# Patient Record
Sex: Male | Born: 2000 | Race: Black or African American | Hispanic: No | Marital: Single | Smoking: Never smoker
Health system: Southern US, Community
[De-identification: ages and names within clinical notes are randomized; demographics above are authoritative.]

## PROBLEM LIST (undated history)

## (undated) ENCOUNTER — Emergency Department: Admission: EM | Discharge status: Absent without leave | Payer: Medicaid Other

## (undated) DIAGNOSIS — J45909 Unspecified asthma, uncomplicated: Secondary | ICD-10-CM

## (undated) DIAGNOSIS — R0981 Nasal congestion: Secondary | ICD-10-CM

---
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
## 2020-01-16 ENCOUNTER — Ambulatory Visit: Payer: Medicaid Other | Attending: Internal Medicine

## 2020-01-16 DIAGNOSIS — Z23 Encounter for immunization: Secondary | ICD-10-CM

## 2020-01-16 NOTE — Progress Notes (Signed)
   Covid-19 Vaccination Clinic  Name:  Nathan Vaughn    MRN: [id number] DOB: May 26, 2000  01/16/2020  Mr. Nathan Vaughn was observed post Covid-19 immunization for 15 minutes without incident. He was provided with Vaccine Information Sheet and instruction to access the V-Safe system.   Mr. Nathan Vaughn was instructed to call 911 with any severe reactions post vaccine: Marland Kitchen Difficulty breathing  . Swelling of face and throat  . A fast heartbeat  . A bad rash all over body  . Dizziness and weakness   Immunizations Administered    No immunizations on file.

## 2021-02-14 ENCOUNTER — Encounter: Payer: Self-pay | Admitting: Nurse Practitioner

## 2021-02-14 ENCOUNTER — Ambulatory Visit (INDEPENDENT_AMBULATORY_CARE_PROVIDER_SITE_OTHER): Payer: Medicaid Other | Admitting: Nurse Practitioner

## 2021-02-14 ENCOUNTER — Other Ambulatory Visit: Payer: Self-pay

## 2021-02-14 VITALS — BP 116/68 | HR 60 | Temp 98.3°F | Resp 16 | Ht 69.0 in | Wt 308.0 lb

## 2021-02-14 DIAGNOSIS — Z6841 Body Mass Index (BMI) 40.0 and over, adult: Secondary | ICD-10-CM

## 2021-02-14 DIAGNOSIS — J4520 Mild intermittent asthma, uncomplicated: Secondary | ICD-10-CM | POA: Diagnosis not present

## 2021-02-14 DIAGNOSIS — M778 Other enthesopathies, not elsewhere classified: Secondary | ICD-10-CM | POA: Diagnosis not present

## 2021-02-14 DIAGNOSIS — Z7689 Persons encountering health services in other specified circumstances: Secondary | ICD-10-CM | POA: Diagnosis not present

## 2021-02-14 MED ORDER — IBUPROFEN 100 MG/5ML PO SUSP: 800.0000 mg | Freq: Three times a day (TID) | ORAL | Status: DC

## 2021-02-14 MED ORDER — MONTELUKAST SODIUM 5 MG PO CHEW: 10.0000 mg | CHEWABLE_TABLET | Freq: Every day | ORAL | 5 refills | Status: DC

## 2021-02-14 MED ORDER — ALBUTEROL SULFATE (2.5 MG/3ML) 0.083% IN NEBU: 2.5000 mg | INHALATION_SOLUTION | Freq: Four times a day (QID) | RESPIRATORY_TRACT | 0 refills | Status: AC | PRN

## 2021-02-14 NOTE — Progress Notes (Signed)
[facility] [place], [place] [address]  Internal MEDICINE  Office Visit Note  Patient Name: Nathan Vaughn  [id number]  [id number]  Date of Service: 02/14/2021   Complaints/HPI Pt is here for establishment of PCP. Chief Complaint  Patient presents with   New Patient (Initial Visit)    Right wrist pain, noticed it when using the mouse, started 2 weeks ago   HPI Nathan Vaughn presents for a new patient visit to establish care. He is a well appearing 21 yo male patient with a history of asthma, accompanied by his mother to his office visit today. His medical history is significant for asthma. His only surgery was a tonsillectomy and he has a family history of asthma, COPD and arthritis. He lives at home with family and works full time at an [employer] in the area. He has a poor diet, eats a lot of fast food and rarely engages in physical activity. He is a nonsmoker and denies alcohol or recreational drug use. When taking medications, he cannot take pill form unless it is chewable or liquid. His last annual physical exam was in February or march last year.  His only pain is his right wrist. He has a wrist brace on today. He was previously seen at [facility] for wrist pain on 02/11/21 (3 days ago). The pain started a couple of weeks ago and he initially noticed it when he was using a computer mouse. An xray of the wrist was done with no acute abnormalities noted and he was given a prescription for ibuprofen liquid and instructed to take 600 mg every 8 hours as needed for pain.  Patient does not consider himself overweight.   Current Medication: Outpatient Encounter Medications as of 02/14/2021  Medication Sig   fluticasone (FLONASE) 50 MCG/ACT nasal spray PLACE 1 SPRAY IN EACH NOSTRIL ONCE A DAY FOR 30 DAYS AS NEEDED   lansoprazole (PREVACID SOLUTAB) 30 MG disintegrating tablet Take by mouth.   montelukast (SINGULAIR) 5 MG chewable tablet Chew 2 tablets (10 mg total) by  mouth at bedtime.   ondansetron (ZOFRAN-ODT) 4 MG disintegrating tablet Take by mouth.   [DISCONTINUED] albuterol (PROVENTIL) (2.5 MG/3ML) 0.083% nebulizer solution    [DISCONTINUED] ibuprofen (ADVIL) 100 MG/5ML suspension ibuprofen 100 mg/5 mL oral suspension  Take 600 mg every 8 hours by oral route for 10 days.   [DISCONTINUED] montelukast (SINGULAIR) 10 MG tablet Take 10 mg by mouth at bedtime.   albuterol (PROVENTIL) (2.5 MG/3ML) 0.083% nebulizer solution Take 3 mLs (2.5 mg total) by nebulization every 6 (six) hours as needed for wheezing or shortness of breath.   ibuprofen (ADVIL) 100 MG/5ML suspension Take 40 mLs (800 mg total) by mouth every 8 (eight) hours.   No facility-administered encounter medications on file as of 02/14/2021.    Surgical History: History reviewed. No pertinent surgical history.  Medical History: Past Medical History:  Diagnosis Date   Asthma    Sinus congestion     Family History: Family History  Problem Relation Age of Onset   COPD Mother    Asthma Mother    Arthritis Mother    COPD Father    Asthma Father    Arthritis Father    Arthritis Maternal Grandmother    Congestive Heart Failure Maternal Grandmother    Arthritis Paternal Grandmother     Social History   Socioeconomic History   Marital status: Single    Spouse name: Not on file   Number of children: Not  on file   Years of education: Not on file   Highest education level: Not on file  Occupational History   Not on file  Tobacco Use   Smoking status: Never   Smokeless tobacco: Never  Substance and Sexual Activity   Alcohol use: No   Drug use: No   Sexual activity: Not on file  Other Topics Concern   Not on file  Social History Narrative   Not on file   Social Determinants of Health   Financial Resource Strain: Not on file  Food Insecurity: Not on file  Transportation Needs: Not on file  Physical Activity: Not on file  Stress: Not on file  Social Connections: Not on  file  Intimate Partner Violence: Not on file     Review of Systems  Constitutional:  Negative for chills, fatigue and unexpected weight change.  HENT:  Negative for congestion, rhinorrhea, sneezing and sore throat.   Eyes:  Negative for redness.  Respiratory:  Negative for cough, chest tightness and shortness of breath.   Cardiovascular:  Negative for chest pain and palpitations.  Gastrointestinal:  Negative for abdominal pain, constipation, diarrhea, nausea and vomiting.  Genitourinary:  Negative for dysuria and frequency.  Musculoskeletal:  Positive for arthralgias and joint swelling. Negative for back pain and neck pain.       Right wrist  Skin:  Negative for rash.  Neurological: Negative.  Negative for tremors and numbness.  Hematological:  Negative for adenopathy. Does not bruise/bleed easily.  Psychiatric/Behavioral:  Negative for behavioral problems (Depression), sleep disturbance and suicidal ideas. The patient is not nervous/anxious.    Vital Signs: BP 116/68    Pulse 60    Temp 98.3 F (36.8 C)    Resp 16    Ht 5\' 9"  (1.753 m)    Wt (!) 308 lb (139.7 kg)    SpO2 99%    BMI 45.48 kg/m    Physical Exam Vitals reviewed.  Constitutional:      General: He is not in acute distress.    Appearance: Normal appearance. He is obese. He is not ill-appearing.  HENT:     Head: Normocephalic and atraumatic.  Eyes:     Pupils: Pupils are equal, round, and reactive to light.  Cardiovascular:     Rate and Rhythm: Normal rate and regular rhythm.  Pulmonary:     Effort: Pulmonary effort is normal. No respiratory distress.  Musculoskeletal:     Right wrist: Decreased range of motion.     Comments: Pain with flexion of the right wrist, pain with ulnar and radial deviation of the right wrist. Tenderness at the base of the wrist on the palmar side.   Neurological:     Mental Status: He is alert and oriented to person, place, and time.     Cranial Nerves: No cranial nerve deficit.      Coordination: Coordination normal.     Gait: Gait normal.  Psychiatric:        Mood and Affect: Mood normal.        Behavior: Behavior normal.      Assessment/Plan: 1. Tendinitis of flexor tendon of right hand Patient instructed to rest and ice his wrist as need. Encouraged patient to continue to wear the wrist brace to keep his wrist immobilized in neutral position. He can take 800 mg of ibuprofen liquid every 8 hours for inflammation and pain. Patient provided with information about tendinitis and stretches and exercises to do to help stretch and  strengthen his wrist.  - ibuprofen (ADVIL) 100 MG/5ML suspension; Take 40 mLs (800 mg total) by mouth every 8 (eight) hours.  2. Mild intermittent asthma without complication Patient has asthma and has albuterol nebulizer treatments as needed and takes montelukast as well. Has not had an attack in at least 5 years. - albuterol (PROVENTIL) (2.5 MG/3ML) 0.083% nebulizer solution; Take 3 mLs (2.5 mg total) by nebulization every 6 (six) hours as needed for wheezing or shortness of breath.  Dispense: 75 mL; Refill: 0 - montelukast (SINGULAIR) 5 MG chewable tablet; Chew 2 tablets (10 mg total) by mouth at bedtime.  Dispense: 60 tablet; Refill: 5  3. Class 3 severe obesity due to excess calories without serious comorbidity with body mass index (BMI) of 45.0 to 49.9 in adult [facility]) Patient does not see any issue with his current weight. Will discuss obesity at next office visit including screening for symptoms of obstructive sleep apnea and whether a sleep study should be done. Need to discuss diet and exercise further, will also consider testing for diabetes.   4. Encounter to establish care with new doctor Establishing with new PCP, is due for CPE in march.      General Counseling: Nirvan verbalizes understanding of the findings of todays visit and agrees with plan of treatment. I have discussed any further diagnostic evaluation that may be needed or  ordered today. We also reviewed his medications today. he has been encouraged to call the office with any questions or concerns that should arise related to todays visit.    Counseling:  [place] Controlled Substance Database was reviewed by me.  No orders of the defined types were placed in this encounter.   Meds ordered this encounter  Medications   ibuprofen (ADVIL) 100 MG/5ML suspension    Sig: Take 40 mLs (800 mg total) by mouth every 8 (eight) hours.   albuterol (PROVENTIL) (2.5 MG/3ML) 0.083% nebulizer solution    Sig: Take 3 mLs (2.5 mg total) by nebulization every 6 (six) hours as needed for wheezing or shortness of breath.    Dispense:  75 mL    Refill:  0   montelukast (SINGULAIR) 5 MG chewable tablet    Sig: Chew 2 tablets (10 mg total) by mouth at bedtime.    Dispense:  60 tablet    Refill:  5    Return for CPE, Coral Soler PCP at earliest available appt after february. .  Time spent:30 Minutes  This patient was seen by Jonetta Osgood, FNP-C in collaboration with Dr. Clayborn Bigness as a part of collaborative care agreement.    Silvanna Ohmer R. Valetta Fuller, MSN, FNP-C Internal Medicine

## 2021-04-15 ENCOUNTER — Encounter: Payer: Medicaid Other | Admitting: Nurse Practitioner

## 2021-04-15 DIAGNOSIS — Z0289 Encounter for other administrative examinations: Secondary | ICD-10-CM

## 2021-05-01 ENCOUNTER — Ambulatory Visit (INDEPENDENT_AMBULATORY_CARE_PROVIDER_SITE_OTHER): Payer: Medicaid Other | Admitting: Internal Medicine

## 2021-05-01 ENCOUNTER — Encounter: Payer: Self-pay | Admitting: Internal Medicine

## 2021-05-01 ENCOUNTER — Other Ambulatory Visit: Payer: Self-pay

## 2021-05-01 VITALS — BP 132/72 | HR 94 | Temp 97.9°F | Resp 16 | Ht 69.0 in | Wt 308.0 lb

## 2021-05-01 DIAGNOSIS — Z0001 Encounter for general adult medical examination with abnormal findings: Secondary | ICD-10-CM | POA: Diagnosis not present

## 2021-05-01 DIAGNOSIS — K219 Gastro-esophageal reflux disease without esophagitis: Secondary | ICD-10-CM | POA: Diagnosis not present

## 2021-05-01 DIAGNOSIS — J4520 Mild intermittent asthma, uncomplicated: Secondary | ICD-10-CM

## 2021-05-01 DIAGNOSIS — R300 Dysuria: Secondary | ICD-10-CM

## 2021-05-01 DIAGNOSIS — N62 Hypertrophy of breast: Secondary | ICD-10-CM | POA: Diagnosis not present

## 2021-05-01 DIAGNOSIS — Z6841 Body Mass Index (BMI) 40.0 and over, adult: Secondary | ICD-10-CM

## 2021-05-01 NOTE — Progress Notes (Signed)
?[facility] ?[address] ?[place], [place] [address] ? ?Internal MEDICINE  ?Office Visit Note ? ?Patient Name: Nathan Vaughn ? [id number]  ?[id number] ? ?Date of Service: 05/07/2021 ? ?Chief Complaint  ?Patient presents with  ? Annual Exam  ? Asthma  ? ? ? ?HPI ?Pt is here for routine health maintenance examination ?-Patient has past medical history of allergies and asthma which are controlled with medications. ?-He also takes Prevacid for reflux which is controlled with medication as well. ?-He had a sleep study last year and was advised to that does not have sleep apnea however he has to lose weight since then patient has lost over 20 pounds. ?-He still struggling with his weight, patient is not sexually ?-He is very soft-spoken works for [employer] finished high school, sleeps well at night slight anxiety but no depression ?Current Medication: ?Outpatient Encounter Medications as of 05/01/2021  ?Medication Sig  ? albuterol (PROVENTIL) (2.5 MG/3ML) 0.083% nebulizer solution Take 3 mLs (2.5 mg total) by nebulization every 6 (six) hours as needed for wheezing or shortness of breath.  ? fluticasone (FLONASE) 50 MCG/ACT nasal spray PLACE 1 SPRAY IN EACH NOSTRIL ONCE A DAY FOR 30 DAYS AS NEEDED  ? ibuprofen (ADVIL) 100 MG/5ML suspension Take 40 mLs (800 mg total) by mouth every 8 (eight) hours.  ? lansoprazole (PREVACID SOLUTAB) 30 MG disintegrating tablet Take by mouth.  ? montelukast (SINGULAIR) 5 MG chewable tablet Chew 2 tablets (10 mg total) by mouth at bedtime.  ? ondansetron (ZOFRAN-ODT) 4 MG disintegrating tablet Take by mouth.  ? ?No facility-administered encounter medications on file as of 05/01/2021.  ? ? ?Surgical History: ?History reviewed. No pertinent surgical history. ? ?Medical History: ?Past Medical History:  ?Diagnosis Date  ? Asthma   ? Sinus congestion   ? ? ?Family History: ?Family History  ?Problem Relation Age of Onset  ? COPD Mother   ? Asthma Mother   ? Arthritis Mother   ? COPD Father    ? Asthma Father   ? Arthritis Father   ? Arthritis Maternal Grandmother   ? Congestive Heart Failure Maternal Grandmother   ? Arthritis Paternal Grandmother   ? ? ?Social History: ?Social History  ? ?Socioeconomic History  ? Marital status: Single  ?  Spouse name: Not on file  ? Number of children: Not on file  ? Years of education: Not on file  ? Highest education level: Not on file  ?Occupational History  ? Not on file  ?Tobacco Use  ? Smoking status: Never  ? Smokeless tobacco: Never  ?Substance and Sexual Activity  ? Alcohol use: No  ? Drug use: No  ? Sexual activity: Not on file  ?Other Topics Concern  ? Not on file  ?Social History Narrative  ? Not on file  ? ?Social Determinants of Health  ? ?Financial Resource Strain: Not on file  ?Food Insecurity: Not on file  ?Transportation Needs: Not on file  ?Physical Activity: Not on file  ?Stress: Not on file  ?Social Connections: Not on file  ?  ? ? ?Review of Systems  ?Constitutional:  Negative for chills, fatigue and unexpected weight change.  ?HENT:  Positive for postnasal drip. Negative for congestion, rhinorrhea, sneezing and sore throat.   ?Eyes:  Negative for redness.  ?Respiratory:  Negative for cough, chest tightness and shortness of breath.   ?Cardiovascular:  Negative for chest pain and palpitations.  ?Gastrointestinal:  Negative for abdominal pain, constipation, diarrhea, nausea and vomiting.  ?  Genitourinary:  Negative for dysuria and frequency.  ?Musculoskeletal:  Negative for arthralgias, back pain, joint swelling and neck pain.  ?Skin:  Negative for rash.  ?Neurological: Negative.  Negative for tremors and numbness.  ?Hematological:  Negative for adenopathy. Does not bruise/bleed easily.  ?Psychiatric/Behavioral:  Negative for behavioral problems (Depression), sleep disturbance and suicidal ideas. The patient is not nervous/anxious.   ? ? ?Vital Signs: ?BP 132/72   Pulse 94   Temp 97.9 ?F (36.6 ?C)   Resp 16   Ht 5\' 9"  (1.753 m)   Wt (!) 308 lb  (139.7 kg)   SpO2 98%   BMI 45.48 kg/m?  ? ? ?Physical Exam ?Constitutional:   ?   Appearance: Normal appearance.  ?HENT:  ?   Head: Normocephalic and atraumatic.  ?   Nose: Nose normal.  ?   Mouth/Throat:  ?   Mouth: Mucous membranes are moist.  ?   Pharynx: No posterior oropharyngeal erythema.  ?Eyes:  ?   Extraocular Movements: Extraocular movements intact.  ?   Pupils: Pupils are equal, round, and reactive to light.  ?Cardiovascular:  ?   Pulses: Normal pulses.  ?   Heart sounds: Normal heart sounds.  ?Pulmonary:  ?   Effort: Pulmonary effort is normal.  ?   Breath sounds: Normal breath sounds.  ?Chest:  ?   Comments: Enlarged breast  ?Neurological:  ?   General: No focal deficit present.  ?   Mental Status: He is alert.  ?Psychiatric:     ?   Mood and Affect: Mood normal.     ?   Behavior: Behavior normal.  ? ? ? ?LABS: ?Recent Results (from the past 2160 hour(s))  ?UA/M w/rflx Culture, Routine     Status: Abnormal  ? Collection Time: 05/01/21 10:01 AM  ? Specimen: Urine  ? Urine  ?Result Value Ref Range  ? Specific Gravity, UA      >=1.030 (A) 1.005 - 1.030  ? pH, UA 6.5 5.0 - 7.5  ? Color, UA Yellow Yellow  ? Appearance Ur Turbid (A) Clear  ? Leukocytes,UA Negative Negative  ? Protein,UA Trace Negative/Trace  ? Glucose, UA Negative Negative  ? Ketones, UA Trace (A) Negative  ? RBC, UA Negative Negative  ? Bilirubin, UA Negative Negative  ? Urobilinogen, Ur 1.0 0.2 - 1.0 mg/dL  ? Nitrite, UA Negative Negative  ? Microscopic Examination Comment   ?  Comment: Microscopic follows if indicated.  ? Microscopic Examination See below:   ?  Comment: Microscopic was indicated and was performed.  ? Urinalysis Reflex Comment   ?  Comment: This specimen will not reflex to a Urine Culture.  ?Microscopic Examination     Status: None  ? Collection Time: 05/01/21 10:01 AM  ? Urine  ?Result Value Ref Range  ? WBC, UA None seen 0 - 5 /hpf  ? RBC 0-2 0 - 2 /hpf  ? Epithelial Cells (non renal) None seen 0 - 10 /hpf  ? Casts  None seen None seen /lpf  ? Bacteria, UA None seen None seen/Few  ? ? ? ? ? ?Assessment/Plan: ?1. Encounter for general adult medical examination with abnormal findings ?-All preventive health maintenance is updated, will add fasting lipid profile ? ?2. Mild intermittent asthma without complication ?-Continue all medications as before no acute flareups ? ?3. Class 3 severe obesity due to excess calories without serious comorbidity with body mass index (BMI) of 45.0 to 49.9 in adult [facility]) ?-Patient is already  trying to lose weight and is doing well with weight loss  ?Obesity Counseling: ?Risk Assessment: An assessment of behavioral risk factors was made today and includes lack of exercise sedentary lifestyle, lack of portion control and poor dietary habits. ? ?Risk Modification Advice: She was counseled on portion control guidelines. Restricting daily caloric intake to 1500. The detrimental Ambrosio term effects of obesity on her health and ongoing poor compliance was also discussed with the patient. ?  ?4. GERD without esophagitis ?Continue Prevacid as before ? ?5. Idiopathic gynecomastia ?On examination patient has gynecomastia we will check prolactin level and estrogen and testosterone level as well treat as indicated ? ? ?6. Dysuria ?- UA/M w/rflx Culture, Routine ? ?General Counseling: Candido verbalizes understanding of the findings of todays visit and agrees with plan of treatment. I have discussed any further diagnostic evaluation that may be needed or ordered today. We also reviewed his medications today. he has been encouraged to call the office with any questions or concerns that should arise related to todays visit. ? ? ? ?Counseling: ? ?[place] Controlled Substance Database was reviewed by me. ? ?Orders Placed This Encounter  ?Procedures  ? Microscopic Examination  ? UA/M w/rflx Culture, Routine  ? Prolactin  ? TSH + free T4  ? Lipid Profile  ? Testosterone,Free and Total  ? Estrogens, total  ? ? ?No orders of  the defined types were placed in this encounter. ? ? ?Total time spent:35 Minutes ? ?Time spent includes review of chart, medications, test results, and follow up plan with the patient.  ? ? ? ?Lyndon CodeFozia M Karim Aiello, MD ?

## 2021-05-02 LAB — MICROSCOPIC EXAMINATION
Bacteria, UA: NONE SEEN
Casts: NONE SEEN /lpf
Epithelial Cells (non renal): NONE SEEN /hpf (ref 0–10)
WBC, UA: NONE SEEN /hpf (ref 0–5)

## 2021-05-02 LAB — UA/M W/RFLX CULTURE, ROUTINE
Bilirubin, UA: NEGATIVE
Glucose, UA: NEGATIVE
Leukocytes,UA: NEGATIVE
Nitrite, UA: NEGATIVE
RBC, UA: NEGATIVE
Specific Gravity, UA: >=1.03 — AB (ref 1.005–1.030)
Urobilinogen, Ur: 1 mg/dL (ref 0.2–1.0)
pH, UA: 6.5 (ref 5.0–7.5)

## 2021-09-18 ENCOUNTER — Encounter: Payer: Self-pay | Admitting: Nurse Practitioner

## 2021-09-18 ENCOUNTER — Telehealth: Payer: Medicaid Other | Admitting: Nurse Practitioner

## 2021-09-18 ENCOUNTER — Telehealth: Payer: Self-pay

## 2021-09-18 VITALS — Resp 16

## 2021-09-18 DIAGNOSIS — U071 COVID-19: Secondary | ICD-10-CM | POA: Diagnosis not present

## 2021-09-18 DIAGNOSIS — J069 Acute upper respiratory infection, unspecified: Secondary | ICD-10-CM | POA: Diagnosis not present

## 2021-09-18 NOTE — Telephone Encounter (Signed)
Emailed work note to patient-Toni 

## 2021-09-18 NOTE — Progress Notes (Signed)
[facility] [address] [place], [place] [address]  Internal MEDICINE  Telephone Visit  Patient Name: Nathan Vaughn  [id number]  [id number]  Date of Service: 09/18/2021  I connected with the patient at 1245 by telephone and verified the patients identity using two identifiers.   I discussed the limitations, risks, security and privacy concerns of performing an evaluation and management service by telephone and the availability of in person appointments. I also discussed with the patient that there may be a patient responsible charge related to the service.  The patient expressed understanding and agrees to proceed.    Chief Complaint  Patient presents with   Telephone Assessment    [phone] - prefers video call   Telephone Screen   Sore Throat   Sinusitis   Covid Positive    HPI Nathan Vaughn presents for a telehealth virtual visit for COVID positive URI and sinusitis. His main symptoms are sore throat, nasal congestion, slight sinus pressure, runny nose and mild fatigue.  Uses Nasal spray fluticasone,    Current Medication: Outpatient Encounter Medications as of 09/18/2021  Medication Sig   albuterol (PROVENTIL) (2.5 MG/3ML) 0.083% nebulizer solution Take 3 mLs (2.5 mg total) by nebulization every 6 (six) hours as needed for wheezing or shortness of breath.   fluticasone (FLONASE) 50 MCG/ACT nasal spray PLACE 1 SPRAY IN EACH NOSTRIL ONCE A DAY FOR 30 DAYS AS NEEDED   ibuprofen (ADVIL) 100 MG/5ML suspension Take 40 mLs (800 mg total) by mouth every 8 (eight) hours.   lansoprazole (PREVACID SOLUTAB) 30 MG disintegrating tablet Take by mouth.   ondansetron (ZOFRAN-ODT) 4 MG disintegrating tablet Take by mouth.   [DISCONTINUED] montelukast (SINGULAIR) 5 MG chewable tablet Chew 2 tablets (10 mg total) by mouth at bedtime.   No facility-administered encounter medications on file as of 09/18/2021.    Surgical History: History reviewed. No pertinent surgical  history.  Medical History: Past Medical History:  Diagnosis Date   Asthma    Sinus congestion     Family History: Family History  Problem Relation Age of Onset   COPD Mother    Asthma Mother    Arthritis Mother    COPD Father    Asthma Father    Arthritis Father    Arthritis Maternal Grandmother    Congestive Heart Failure Maternal Grandmother    Arthritis Paternal Grandmother     Social History   Socioeconomic History   Marital status: Single    Spouse name: Not on file   Number of children: Not on file   Years of education: Not on file   Highest education level: Not on file  Occupational History   Not on file  Tobacco Use   Smoking status: Never   Smokeless tobacco: Never  Substance and Sexual Activity   Alcohol use: No   Drug use: No   Sexual activity: Not on file  Other Topics Concern   Not on file  Social History Narrative   Not on file   Social Determinants of Health   Financial Resource Strain: Not on file  Food Insecurity: Not on file  Transportation Needs: Not on file  Physical Activity: Not on file  Stress: Not on file  Social Connections: Not on file  Intimate Partner Violence: Not on file      Review of Systems  Constitutional:  Positive for fatigue.  HENT:  Positive for congestion, postnasal drip, rhinorrhea and sinus pressure.   Respiratory:  Negative for cough, chest tightness, shortness  of breath and wheezing.   Cardiovascular: Negative.  Negative for chest pain and palpitations.  Neurological:  Negative for headaches.    Vital Signs: Resp 16    Observation/Objective: He is alert and oriented and engages in conversation appropriately. He does not appear to be in any acute distress over video call.     Assessment/Plan: 1. Upper respiratory tract infection due to COVID-19 virus Mild symptoms, OTC symptomatic relief. No antiviral medication at this time   General Counseling: Nathan Vaughn verbalizes understanding of the findings  of today's phone visit and agrees with plan of treatment. I have discussed any further diagnostic evaluation that may be needed or ordered today. We also reviewed his medications today. he has been encouraged to call the office with any questions or concerns that should arise related to todays visit.  Return if symptoms worsen or fail to improve.   No orders of the defined types were placed in this encounter.   No orders of the defined types were placed in this encounter.   Time spent:30 Minutes Time spent with patient included reviewing progress notes, labs, imaging studies, and discussing plan for follow up.  [place] Controlled Substance Database was reviewed by me for overdose risk score (ORS) if appropriate.  This patient was seen by Sallyanne Kuster, FNP-C in collaboration with Dr. Beverely Risen as a part of collaborative care agreement.  Ardie Dragoo R. Tedd Sias, MSN, FNP-C Internal medicine

## 2021-09-28 ENCOUNTER — Other Ambulatory Visit: Payer: Self-pay | Admitting: Nurse Practitioner

## 2021-09-28 DIAGNOSIS — J4520 Mild intermittent asthma, uncomplicated: Secondary | ICD-10-CM

## 2022-03-25 ENCOUNTER — Telehealth (INDEPENDENT_AMBULATORY_CARE_PROVIDER_SITE_OTHER): Payer: 59 | Admitting: Internal Medicine

## 2022-03-25 ENCOUNTER — Telehealth: Payer: Self-pay | Admitting: Nurse Practitioner

## 2022-03-25 VITALS — Ht 69.0 in | Wt 312.0 lb

## 2022-03-25 DIAGNOSIS — J45909 Unspecified asthma, uncomplicated: Secondary | ICD-10-CM | POA: Diagnosis not present

## 2022-03-25 MED ORDER — AZITHROMYCIN 200 MG/5ML PO SUSR: ORAL | 0 refills | Status: DC

## 2022-03-25 NOTE — Progress Notes (Signed)
[facility] [place], [place] [address]  Internal MEDICINE  Telephone Visit  Patient Name: Nathan Vaughn  [id number]  [id number]  Date of Service: 04/01/2022  I connected with the patient at 905 by telephone and verified the patients identity using two identifiers.   I discussed the limitations, risks, security and privacy concerns of performing an evaluation and management service by telephone and the availability of in person appointments. I also discussed with the patient that there may be a patient responsible charge related to the service.  The patient expressed understanding and agrees to proceed.    Chief Complaint  Patient presents with   Telephone Assessment    [id number]   Telephone Screen   Sinusitis    Symptoms last  Friday and covid test negative    Sore Throat   Cough   Headache    HPI  Pt has been sick since Friday Has sore throat, runny nose and cough Mild chills as well along with headaches  Pt has h/o asthma as well    Current Medication: Outpatient Encounter Medications as of 03/25/2022  Medication Sig   albuterol (PROVENTIL) (2.5 MG/3ML) 0.083% nebulizer solution Take 3 mLs (2.5 mg total) by nebulization every 6 (six) hours as needed for wheezing or shortness of breath.   azithromycin (ZITHROMAX) 200 MG/5ML suspension Take 10 ml every day for 5 days   fluticasone (FLONASE) 50 MCG/ACT nasal spray PLACE 1 SPRAY IN EACH NOSTRIL ONCE A DAY FOR 30 DAYS AS NEEDED   ibuprofen (ADVIL) 100 MG/5ML suspension Take 40 mLs (800 mg total) by mouth every 8 (eight) hours.   lansoprazole (PREVACID SOLUTAB) 30 MG disintegrating tablet Take by mouth.   ondansetron (ZOFRAN-ODT) 4 MG disintegrating tablet Take by mouth.   [DISCONTINUED] montelukast (SINGULAIR) 5 MG chewable tablet CHEW 2 TABLETS (10 MG TOTAL) BY MOUTH AT BEDTIME.   No facility-administered encounter medications on file as of 03/25/2022.    Surgical History: No past surgical  history on file.  Medical History: Past Medical History:  Diagnosis Date   Asthma    Sinus congestion     Family History: Family History  Problem Relation Age of Onset   COPD Mother    Asthma Mother    Arthritis Mother    COPD Father    Asthma Father    Arthritis Father    Arthritis Maternal Grandmother    Congestive Heart Failure Maternal Grandmother    Arthritis Paternal Grandmother     Social History   Socioeconomic History   Marital status: Single    Spouse name: Not on file   Number of children: Not on file   Years of education: Not on file   Highest education level: Not on file  Occupational History   Not on file  Tobacco Use   Smoking status: Never   Smokeless tobacco: Never  Substance and Sexual Activity   Alcohol use: No   Drug use: No   Sexual activity: Not on file  Other Topics Concern   Not on file  Social History Narrative   Not on file   Social Determinants of Health   Financial Resource Strain: Not on file  Food Insecurity: Not on file  Transportation Needs: Not on file  Physical Activity: Not on file  Stress: Not on file  Social Connections: Not on file  Intimate Partner Violence: Not on file      Review of Systems  Constitutional:  Negative for fatigue and fever.  HENT:  Negative for congestion, mouth sores and postnasal drip.   Respiratory:  Positive for cough.   Cardiovascular:  Negative for chest pain.  Genitourinary:  Negative for flank pain.  Psychiatric/Behavioral: Negative.      Vital Signs: Ht '5\' 9"'$  (1.753 m)   Wt (!) 312 lb (141.5 kg)   BMI 46.07 kg/m    Observation/Objective:  Coughing    Assessment/Plan: 1. Acute asthmatic bronchitis Pt will get work note  - azithromycin (ZITHROMAX) 200 MG/5ML suspension; Take 10 ml every day for 5 days  Dispense: 50 mL; Refill: 0   General Counseling: Nathan Vaughn verbalizes understanding of the findings of today's phone visit and agrees with plan of treatment. I have  discussed any further diagnostic evaluation that may be needed or ordered today. We also reviewed his medications today. he has been encouraged to call the office with any questions or concerns that should arise related to todays visit.    No orders of the defined types were placed in this encounter.   Meds ordered this encounter  Medications   azithromycin (ZITHROMAX) 200 MG/5ML suspension    Sig: Take 10 ml every day for 5 days    Dispense:  50 mL    Refill:  0    Time spent:10 Minutes    Dr Lavera Guise Internal medicine

## 2022-03-25 NOTE — Telephone Encounter (Signed)
Work note emailed to [employer]

## 2022-03-26 ENCOUNTER — Telehealth: Payer: 59 | Admitting: Internal Medicine

## 2022-03-28 ENCOUNTER — Other Ambulatory Visit: Payer: Self-pay | Admitting: Internal Medicine

## 2022-03-28 DIAGNOSIS — J4520 Mild intermittent asthma, uncomplicated: Secondary | ICD-10-CM

## 2022-05-06 ENCOUNTER — Ambulatory Visit (INDEPENDENT_AMBULATORY_CARE_PROVIDER_SITE_OTHER): Payer: Self-pay | Admitting: Nurse Practitioner

## 2022-05-06 ENCOUNTER — Encounter: Payer: Self-pay | Admitting: Nurse Practitioner

## 2022-05-06 VITALS — BP 129/80 | HR 74 | Temp 98.2°F | Resp 16 | Ht 69.0 in | Wt 324.4 lb

## 2022-05-06 DIAGNOSIS — Z0001 Encounter for general adult medical examination with abnormal findings: Secondary | ICD-10-CM | POA: Diagnosis not present

## 2022-05-06 DIAGNOSIS — N62 Hypertrophy of breast: Secondary | ICD-10-CM

## 2022-05-06 DIAGNOSIS — Z6841 Body Mass Index (BMI) 40.0 and over, adult: Secondary | ICD-10-CM

## 2022-05-06 DIAGNOSIS — M92529 Juvenile osteochondrosis of tibia tubercle, unspecified leg: Secondary | ICD-10-CM | POA: Insufficient documentation

## 2022-05-06 DIAGNOSIS — K219 Gastro-esophageal reflux disease without esophagitis: Secondary | ICD-10-CM | POA: Diagnosis not present

## 2022-05-06 MED ORDER — LANSOPRAZOLE 30 MG PO TBDD: 30.0000 mg | DELAYED_RELEASE_TABLET | Freq: Every day | ORAL | 11 refills | Status: AC | PRN

## 2022-05-06 NOTE — Progress Notes (Signed)
[facility] [place],  [address]  Internal MEDICINE  Office Visit Note  Patient Name: Nathan Vaughn  [id number]  [id number]  Date of Service: 05/06/2022  Chief Complaint  Patient presents with   Annual Exam    HPI [name] presents for an annual well visit and physical exam.  Well-appearing 22 y.o. male with asthma, GERD, and allergic rhinitis Labs:  due for routine labs  New or worsening pain: none Other concerns: none Did not have labs drawn last year      Current Medication: Outpatient Encounter Medications as of 05/06/2022  Medication Sig   albuterol (PROVENTIL) (2.5 MG/3ML) 0.083% nebulizer solution Take 3 mLs (2.5 mg total) by nebulization every 6 (six) hours as needed for wheezing or shortness of breath.   fluticasone (FLONASE) 50 MCG/ACT nasal spray PLACE 1 SPRAY IN EACH NOSTRIL ONCE A DAY FOR 30 DAYS AS NEEDED   montelukast (SINGULAIR) 5 MG chewable tablet CHEW 2 TABS BY MOUTH AT BEDTIME   [DISCONTINUED] azithromycin (ZITHROMAX) 200 MG/5ML suspension Take 10 ml every day for 5 days   [DISCONTINUED] ibuprofen (ADVIL) 100 MG/5ML suspension Take 40 mLs (800 mg total) by mouth every 8 (eight) hours.   [DISCONTINUED] lansoprazole (PREVACID SOLUTAB) 30 MG disintegrating tablet Take by mouth.   [DISCONTINUED] ondansetron (ZOFRAN-ODT) 4 MG disintegrating tablet Take by mouth.   lansoprazole (PREVACID SOLUTAB) 30 MG disintegrating tablet Take 1 tablet (30 mg total) by mouth daily as needed (acid reflux).   No facility-administered encounter medications on file as of 05/06/2022.    Surgical History: History reviewed. No pertinent surgical history.  Medical History: Past Medical History:  Diagnosis Date   Asthma    Sinus congestion     Family History: Family History  Problem Relation Age of Onset   COPD Mother    Asthma Mother    Arthritis Mother    COPD Father    Asthma Father    Arthritis Father    Arthritis Maternal Grandmother     Congestive Heart Failure Maternal Grandmother    Arthritis Paternal Grandmother     Social History   Socioeconomic History   Marital status: Single    Spouse name: Not on file   Number of children: Not on file   Years of education: Not on file   Highest education level: Not on file  Occupational History   Not on file  Tobacco Use   Smoking status: Never   Smokeless tobacco: Never  Substance and Sexual Activity   Alcohol use: No   Drug use: No   Sexual activity: Not on file  Other Topics Concern   Not on file  Social History Narrative   Not on file   Social Determinants of Health   Financial Resource Strain: Not on file  Food Insecurity: Not on file  Transportation Needs: Not on file  Physical Activity: Not on file  Stress: Not on file  Social Connections: Not on file  Intimate Partner Violence: Not on file      Review of Systems  Constitutional:  Negative for chills, fatigue and unexpected weight change.  HENT:  Positive for postnasal drip. Negative for congestion, rhinorrhea, sneezing and sore throat.   Eyes:  Negative for redness.  Respiratory:  Negative for cough, chest tightness and shortness of breath.   Cardiovascular:  Negative for chest pain and palpitations.  Gastrointestinal:  Negative for abdominal pain, constipation, diarrhea, nausea and vomiting.  Genitourinary:  Negative for dysuria and frequency.  Musculoskeletal:  Negative for arthralgias, back pain, joint swelling and neck pain.  Skin:  Negative for rash.  Neurological: Negative.  Negative for tremors and numbness.  Hematological:  Negative for adenopathy. Does not bruise/bleed easily.  Psychiatric/Behavioral:  Negative for behavioral problems (Depression), sleep disturbance and suicidal ideas. The patient is not nervous/anxious.     Vital Signs: BP 129/80   Pulse 74   Temp 98.2 F (36.8 C)   Resp 16   Ht 5\' 9"  (1.753 m)   Wt (!) 324 lb 6.4 oz (147.1 kg)   SpO2 96%   BMI 47.91 kg/m     Physical Exam Vitals reviewed.  Constitutional:      General: He is awake. He is not in acute distress.    Appearance: Normal appearance. He is well-developed and well-groomed. He is obese. He is not ill-appearing.  HENT:     Head: Normocephalic and atraumatic.     Right Ear: Tympanic membrane, ear canal and external ear normal.     Left Ear: Tympanic membrane, ear canal and external ear normal.     Nose: Nose normal.     Mouth/Throat:     Lips: Pink.     Mouth: Mucous membranes are moist.     Pharynx: Oropharynx is clear. Uvula midline. No oropharyngeal exudate or posterior oropharyngeal erythema.  Eyes:     General: Lids are normal. Vision grossly intact. Gaze aligned appropriately.     Extraocular Movements: Extraocular movements intact.     Conjunctiva/sclera: Conjunctivae normal.     Pupils: Pupils are equal, round, and reactive to light.  Neck:     Trachea: Trachea and phonation normal.  Cardiovascular:     Rate and Rhythm: Normal rate and regular rhythm.     Pulses: Normal pulses.     Heart sounds: Normal heart sounds, S1 normal and S2 normal.  Pulmonary:     Effort: Pulmonary effort is normal. No accessory muscle usage or respiratory distress.     Breath sounds: Normal breath sounds and air entry. No decreased breath sounds or wheezing.  Chest:     Chest wall: Swelling and edema present.     Comments: Enlarged breast  Abdominal:     General: Bowel sounds are normal.     Palpations: Abdomen is soft.     Tenderness: There is no abdominal tenderness.  Musculoskeletal:     Cervical back: Normal range of motion and neck supple.     Right lower leg: No edema.     Left lower leg: No edema.  Lymphadenopathy:     Cervical: No cervical adenopathy.  Skin:    General: Skin is warm and dry.     Capillary Refill: Capillary refill takes less than 2 seconds.  Neurological:     General: No focal deficit present.     Mental Status: He is alert and oriented to person, place,  and time.  Psychiatric:        Mood and Affect: Mood and affect normal.        Behavior: Behavior normal. Behavior is cooperative.        Thought Content: Thought content normal.        Judgment: Judgment normal.        Assessment/Plan: 1. Encounter for general adult medical examination with abnormal findings Age-appropriate preventive screenings and vaccinations discussed, annual physical exam completed. Routine labs for health maintenance ordered, see below. PHM updated.  - Prolactin - Testosterone,Free and Total - Estrogens, total - Lipid  Profile - TSH + free T4 - CMP14+EGFR  2. GERD without esophagitis Routine labs ordered. Continue lansoprazole as prescribed.  - Prolactin - Testosterone,Free and Total - Estrogens, total - Lipid Profile - TSH + free T4 - CMP14+EGFR - lansoprazole (PREVACID SOLUTAB) 30 MG disintegrating tablet; Take 1 tablet (30 mg total) by mouth daily as needed (acid reflux).  Dispense: 30 tablet; Refill: 11  3. Idiopathic gynecomastia Routine and additional labs ordered - Prolactin - Testosterone,Free and Total - Estrogens, total - Lipid Profile - TSH + free T4 - CMP14+EGFR  4. Class 3 severe obesity due to excess calories without serious comorbidity with body mass index (BMI) of 45.0 to 49.9 in adult Routine labs ordered - Prolactin - Testosterone,Free and Total - Estrogens, total - Lipid Profile - TSH + free T4 - CMP14+EGFR      General Counseling: [name] verbalizes understanding of the findings of todays visit and agrees with plan of treatment. I have discussed any further diagnostic evaluation that may be needed or ordered today. We also reviewed his medications today. he has been encouraged to call the office with any questions or concerns that should arise related to todays visit.    Orders Placed This Encounter  Procedures   Prolactin   Testosterone,Free and Total   Estrogens, total   Lipid Profile   TSH + free T4    CMP14+EGFR    Meds ordered this encounter  Medications   lansoprazole (PREVACID SOLUTAB) 30 MG disintegrating tablet    Sig: Take 1 tablet (30 mg total) by mouth daily as needed (acid reflux).    Dispense:  30 tablet    Refill:  11    Return in about 1 year (around 05/06/2023) for CPE, [place] PCP and otherwise as needed..   Total time spent:30 Minutes Time spent includes review of chart, medications, test results, and follow up plan with the patient.   [place] Controlled Substance Database was reviewed by me.  This patient was seen by Jonetta Osgood, FNP-C in collaboration with Dr. Clayborn Bigness as a part of collaborative care agreement.  Geron Mulford R. Valetta Fuller, MSN, FNP-C Internal medicine

## 2022-06-03 LAB — CMP14+EGFR: BUN: 14 mg/dL (ref 6–20)

## 2022-06-04 ENCOUNTER — Ambulatory Visit: Payer: Self-pay | Admitting: Nurse Practitioner

## 2022-06-04 LAB — CMP14+EGFR: Bilirubin Total: 0.4 mg/dL (ref 0.0–1.2)

## 2022-06-04 LAB — TESTOSTERONE,FREE AND TOTAL: Testosterone, Free: 14.2 pg/mL (ref 9.3–26.5)

## 2022-06-06 LAB — CMP14+EGFR
ALT: 43 IU/L (ref 0–44)
AST: 33 IU/L (ref 0–40)
Albumin/Globulin Ratio: 1.6 (ref 1.2–2.2)
Albumin: 4.3 g/dL (ref 4.3–5.2)
Alkaline Phosphatase: 74 IU/L (ref 44–121)
BUN/Creatinine Ratio: 15 (ref 9–20)
CO2: 20 mmol/L (ref 20–29)
Calcium: 9.1 mg/dL (ref 8.7–10.2)
Chloride: 100 mmol/L (ref 96–106)
Creatinine, Ser: 0.93 mg/dL (ref 0.76–1.27)
Globulin, Total: 2.7 g/dL (ref 1.5–4.5)
Glucose: 107 mg/dL — ABNORMAL HIGH (ref 70–99)
Potassium: 4.5 mmol/L (ref 3.5–5.2)
Sodium: 137 mmol/L (ref 134–144)
Total Protein: 7 g/dL (ref 6.0–8.5)
eGFR: 119 mL/min/{1.73_m2} (ref >59)

## 2022-06-06 LAB — PROLACTIN: Prolactin: 18.6 ng/mL (ref 3.6–31.5)

## 2022-06-06 LAB — ESTROGENS, TOTAL: Estrogen: 139 pg/mL (ref 56–213)

## 2022-06-06 LAB — TESTOSTERONE,FREE AND TOTAL: Testosterone: 385 ng/dL (ref 264–916)

## 2022-06-06 LAB — LIPID PANEL
Chol/HDL Ratio: 2.5 ratio (ref 0.0–5.0)
Cholesterol, Total: 186 mg/dL (ref 100–199)
HDL: 73 mg/dL (ref >39)
LDL Chol Calc (NIH): 103 mg/dL — ABNORMAL HIGH (ref 0–99)
Triglycerides: 51 mg/dL (ref 0–149)
VLDL Cholesterol Cal: 10 mg/dL (ref 5–40)

## 2022-06-06 LAB — TSH+FREE T4
Free T4: 1.17 ng/dL (ref 0.82–1.77)
TSH: 2.21 u[IU]/mL (ref 0.450–4.500)

## 2022-06-11 ENCOUNTER — Ambulatory Visit: Payer: Self-pay | Admitting: Nurse Practitioner

## 2022-09-20 ENCOUNTER — Other Ambulatory Visit: Payer: Self-pay | Admitting: Internal Medicine

## 2022-09-20 DIAGNOSIS — J4520 Mild intermittent asthma, uncomplicated: Secondary | ICD-10-CM

## 2022-09-22 ENCOUNTER — Telehealth: Payer: Self-pay | Admitting: Nurse Practitioner

## 2022-09-22 NOTE — Telephone Encounter (Signed)
Per AA, patient does not need follow up appointment. He is good until April of 2025 when he is due for cpe-Toni

## 2022-11-06 ENCOUNTER — Telehealth (INDEPENDENT_AMBULATORY_CARE_PROVIDER_SITE_OTHER): Payer: Self-pay | Admitting: Nurse Practitioner

## 2022-11-06 ENCOUNTER — Encounter: Payer: Self-pay | Admitting: Nurse Practitioner

## 2022-11-06 VITALS — Resp 16 | Ht 69.0 in | Wt 321.0 lb

## 2022-11-06 DIAGNOSIS — E559 Vitamin D deficiency, unspecified: Secondary | ICD-10-CM | POA: Diagnosis not present

## 2022-11-06 DIAGNOSIS — R5383 Other fatigue: Secondary | ICD-10-CM | POA: Diagnosis not present

## 2022-11-06 DIAGNOSIS — E538 Deficiency of other specified B group vitamins: Secondary | ICD-10-CM | POA: Diagnosis not present

## 2022-11-06 DIAGNOSIS — M791 Myalgia, unspecified site: Secondary | ICD-10-CM | POA: Diagnosis not present

## 2022-11-06 NOTE — Progress Notes (Signed)
[facility] [address] [place], [place] [address]  Internal MEDICINE  Telephone Visit  Patient Name: Nathan Vaughn  [id number]  [id number]  Date of Service: 11/06/2022  I connected with the patient at 1630 by telephone and verified the patients identity using two identifiers.   I discussed the limitations, risks, security and privacy concerns of performing an evaluation and management service by telephone and the availability of in person appointments. I also discussed with the patient that there may be a patient responsible charge related to the service.  The patient expressed understanding and agrees to proceed.    Chief Complaint  Patient presents with   Telephone Screen    Fatigue all the time and sore muscles.     Telephone Assessment    HPI Nathan Vaughn presents for a telehealth virtual visit for fatigue and sore muscles. Onset was about a month ago Quality of sleep is good but routine is sporadic  No injuries or new workouts or lifting weight. No change in job or roles at job No recent infections or GI symptoms.  No concern for dehydration.  Reports eating well and drinking enough water.  Wants labs ordered     Current Medication: Outpatient Encounter Medications as of 11/06/2022  Medication Sig   albuterol (PROVENTIL) (2.5 MG/3ML) 0.083% nebulizer solution Take 3 mLs (2.5 mg total) by nebulization every 6 (six) hours as needed for wheezing or shortness of breath.   fluticasone (FLONASE) 50 MCG/ACT nasal spray PLACE 1 SPRAY IN EACH NOSTRIL ONCE A DAY FOR 30 DAYS AS NEEDED   lansoprazole (PREVACID SOLUTAB) 30 MG disintegrating tablet Take 1 tablet (30 mg total) by mouth daily as needed (acid reflux).   montelukast (SINGULAIR) 5 MG chewable tablet CHEW 2 TABS BY MOUTH AT BEDTIME   No facility-administered encounter medications on file as of 11/06/2022.    Surgical History: History reviewed. No pertinent surgical history.  Medical History: Past Medical  History:  Diagnosis Date   Asthma    Sinus congestion     Family History: Family History  Problem Relation Age of Onset   COPD Mother    Asthma Mother    Arthritis Mother    COPD Father    Asthma Father    Arthritis Father    Arthritis Maternal Grandmother    Congestive Heart Failure Maternal Grandmother    Arthritis Paternal Grandmother     Social History   Socioeconomic History   Marital status: Single    Spouse name: Not on file   Number of children: Not on file   Years of education: Not on file   Highest education level: Not on file  Occupational History   Not on file  Tobacco Use   Smoking status: Never   Smokeless tobacco: Never  Substance and Sexual Activity   Alcohol use: No   Drug use: No   Sexual activity: Not on file  Other Topics Concern   Not on file  Social History Narrative   Not on file   Social Determinants of Health   Financial Resource Strain: Not on file  Food Insecurity: Not on file  Transportation Needs: Not on file  Physical Activity: Not on file  Stress: Not on file  Social Connections: Not on file  Intimate Partner Violence: Not on file      Review of Systems  Constitutional:  Positive for fatigue.  Respiratory: Negative.  Negative for cough, chest tightness, shortness of breath and wheezing.   Cardiovascular:  Negative  for chest pain and palpitations.  Musculoskeletal:  Positive for arthralgias.  Neurological:  Positive for weakness and headaches.    Vital Signs: Resp 16   Ht 5\' 9"  (1.753 m)   Wt (!) 321 lb (145.6 kg)   BMI 47.40 kg/m    Observation/Objective: He is alert and oriented. No acute distress noted.     Assessment/Plan: 1. Other fatigue Labs ordered for further evaluation - CBC with Differential/Platelet - CMP14+EGFR - Magnesium - TSH+T4F+T3Free+ThyAbs+TPO+VD25 - B12 and Folate Panel - Iron, TIBC and Ferritin Panel  2. Myalgia Labs ordered for further evaluation - CBC with  Differential/Platelet - CMP14+EGFR - Magnesium - TSH+T4F+T3Free+ThyAbs+TPO+VD25 - B12 and Folate Panel - Iron, TIBC and Ferritin Panel  3. B12 deficiency Labs ordered for further evaluation - CBC with Differential/Platelet - CMP14+EGFR - Magnesium - TSH+T4F+T3Free+ThyAbs+TPO+VD25 - B12 and Folate Panel - Iron, TIBC and Ferritin Panel  4. Vitamin D deficiency Labs ordered for further evaluation.  - CBC with Differential/Platelet - CMP14+EGFR - Magnesium - TSH+T4F+T3Free+ThyAbs+TPO+VD25 - B12 and Folate Panel - Iron, TIBC and Ferritin Panel   General Counseling: Nathan Vaughn verbalizes understanding of the findings of today's phone visit and agrees with plan of treatment. I have discussed any further diagnostic evaluation that may be needed or ordered today. We also reviewed his medications today. he has been encouraged to call the office with any questions or concerns that should arise related to todays visit.  Return if symptoms worsen or fail to improve, for will call patient with results and next steps.   Orders Placed This Encounter  Procedures   CBC with Differential/Platelet   CMP14+EGFR   Magnesium   TSH+T4F+T3Free+ThyAbs+TPO+VD25   B12 and Folate Panel   Iron, TIBC and Ferritin Panel    No orders of the defined types were placed in this encounter.   Time spent:10 Minutes Time spent with patient included reviewing progress notes, labs, imaging studies, and discussing plan for follow up.  [place] Controlled Substance Database was reviewed by me for overdose risk score (ORS) if appropriate.  This patient was seen by Sallyanne Kuster, FNP-C in collaboration with Dr. Beverely Risen as a part of collaborative care agreement.  Tabias Swayze R. Tedd Sias, MSN, FNP-C Internal medicine

## 2022-11-08 ENCOUNTER — Encounter: Payer: Self-pay | Admitting: Nurse Practitioner

## 2022-11-08 LAB — B12 AND FOLATE PANEL
Folate: >20 ng/mL (ref >3.0)
Vitamin B-12: 1119 pg/mL (ref 232–1245)

## 2022-11-08 LAB — CBC WITH DIFFERENTIAL/PLATELET
Basophils Absolute: 0 10*3/uL (ref 0.0–0.2)
Basos: 1 %
EOS (ABSOLUTE): 0.1 10*3/uL (ref 0.0–0.4)
Eos: 2 %
Hematocrit: 45.5 % (ref 37.5–51.0)
Hemoglobin: 14.1 g/dL (ref 13.0–17.7)
Immature Grans (Abs): 0 10*3/uL (ref 0.0–0.1)
Immature Granulocytes: 0 %
Lymphocytes Absolute: 2.2 10*3/uL (ref 0.7–3.1)
Lymphs: 37 %
MCH: 27.5 pg (ref 26.6–33.0)
MCHC: 31 g/dL — ABNORMAL LOW (ref 31.5–35.7)
MCV: 89 fL (ref 79–97)
Monocytes Absolute: 0.7 10*3/uL (ref 0.1–0.9)
Monocytes: 11 %
Neutrophils Absolute: 2.9 10*3/uL (ref 1.4–7.0)
Neutrophils: 49 %
Platelets: 178 10*3/uL (ref 150–450)
RBC: 5.13 x10E6/uL (ref 4.14–5.80)
RDW: 12 % (ref 11.6–15.4)
WBC: 5.9 10*3/uL (ref 3.4–10.8)

## 2022-11-08 LAB — TSH+T4F+T3FREE+THYABS+TPO+VD25
Free T4: 1.17 ng/dL (ref 0.82–1.77)
T3, Free: 3.7 pg/mL (ref 2.0–4.4)
TSH: 3.37 u[IU]/mL (ref 0.450–4.500)
Thyroglobulin Antibody: <1 [IU]/mL (ref 0.0–0.9)
Thyroperoxidase Ab SerPl-aCnc: 9 [IU]/mL (ref 0–34)
Vit D, 25-Hydroxy: 18.7 ng/mL — ABNORMAL LOW (ref 30.0–100.0)

## 2022-11-08 LAB — CMP14+EGFR
ALT: 45 [IU]/L — ABNORMAL HIGH (ref 0–44)
AST: 34 [IU]/L (ref 0–40)
Albumin: 4.4 g/dL (ref 4.3–5.2)
Alkaline Phosphatase: 75 [IU]/L (ref 44–121)
BUN/Creatinine Ratio: 14 (ref 9–20)
BUN: 15 mg/dL (ref 6–20)
Bilirubin Total: 0.5 mg/dL (ref 0.0–1.2)
CO2: 23 mmol/L (ref 20–29)
Calcium: 9.2 mg/dL (ref 8.7–10.2)
Chloride: 103 mmol/L (ref 96–106)
Creatinine, Ser: 1.06 mg/dL (ref 0.76–1.27)
Globulin, Total: 2.7 g/dL (ref 1.5–4.5)
Glucose: 100 mg/dL — ABNORMAL HIGH (ref 70–99)
Potassium: 4.2 mmol/L (ref 3.5–5.2)
Sodium: 139 mmol/L (ref 134–144)
Total Protein: 7.1 g/dL (ref 6.0–8.5)
eGFR: 102 mL/min/{1.73_m2} (ref >59)

## 2022-11-08 LAB — IRON,TIBC AND FERRITIN PANEL
Ferritin: 172 ng/mL (ref 30–400)
Iron Saturation: 19 % (ref 15–55)
Iron: 55 ug/dL (ref 38–169)
Total Iron Binding Capacity: 297 ug/dL (ref 250–450)
UIBC: 242 ug/dL (ref 111–343)

## 2022-11-08 LAB — MAGNESIUM: Magnesium: 2 mg/dL (ref 1.6–2.3)

## 2022-12-13 ENCOUNTER — Other Ambulatory Visit: Payer: Self-pay | Admitting: Internal Medicine

## 2022-12-13 MED ORDER — ERGOCALCIFEROL 1.25 MG (50000 UT) PO CAPS: ORAL_CAPSULE | ORAL | 3 refills | Status: AC

## 2022-12-15 ENCOUNTER — Telehealth: Payer: Self-pay

## 2022-12-15 NOTE — Telephone Encounter (Signed)
-----   Message from [facility] sent at 12/13/2022  9:56 AM EST ----- I sent a prescription of drisdol ( vit D) for him, he also needs to take calcium one tab otc ( usually is 400-550 mg )

## 2022-12-15 NOTE — Telephone Encounter (Signed)
Patient notified

## 2023-03-20 ENCOUNTER — Other Ambulatory Visit: Payer: Self-pay | Admitting: Nurse Practitioner

## 2023-03-20 DIAGNOSIS — J4520 Mild intermittent asthma, uncomplicated: Secondary | ICD-10-CM

## 2023-03-20 NOTE — Telephone Encounter (Signed)
Please send med.Marland Kitchen

## 2023-04-02 ENCOUNTER — Telehealth: Payer: 59 | Admitting: Physician Assistant

## 2023-04-02 ENCOUNTER — Encounter: Payer: Self-pay | Admitting: Nurse Practitioner

## 2023-04-02 VITALS — Resp 16 | Ht 69.0 in | Wt 342.0 lb

## 2023-04-02 DIAGNOSIS — J0110 Acute frontal sinusitis, unspecified: Secondary | ICD-10-CM | POA: Diagnosis not present

## 2023-04-02 MED ORDER — AZITHROMYCIN 200 MG/5ML PO SUSR: ORAL | 0 refills | Status: DC

## 2023-04-02 NOTE — Progress Notes (Signed)
 [facility] [address] [place], [place] [address]  Internal MEDICINE  Telephone Visit  Patient Name: Nathan Vaughn  [id number]  [id number]  Date of Service: 04/02/2023  I connected with the patient at 11:32 by telephone and verified the patients identity using two identifiers.   I discussed the limitations, risks, security and privacy concerns of performing an evaluation and management service by telephone and the availability of in person appointments. I also discussed with the patient that there may be a patient responsible charge related to the service.  The patient expressed understanding and agrees to proceed.    Chief Complaint  Patient presents with   Telephone Screen    Runny nose, headache, body weak, covid test negative.    Telephone Assessment    HPI Pt is here for virtual sick visit -Monday morning started with runny nose, sinus congestion, headache, fatigue, a little cough. No SOB or wheezing -covid test negative -no sick contacts -using nasal spray, mucinex, and is improving -requires any meds be liquid form  Current Medication: Outpatient Encounter Medications as of 04/02/2023  Medication Sig   albuterol (PROVENTIL) (2.5 MG/3ML) 0.083% nebulizer solution Take 3 mLs (2.5 mg total) by nebulization every 6 (six) hours as needed for wheezing or shortness of breath.   azithromycin (ZITHROMAX) 200 MG/5ML suspension Take 10mL Daily for 5 days   ergocalciferol (DRISDOL) 1.25 MG (50000 UT) capsule Take one cap q week   fluticasone (FLONASE) 50 MCG/ACT nasal spray PLACE 1 SPRAY IN EACH NOSTRIL ONCE A DAY FOR 30 DAYS AS NEEDED   lansoprazole (PREVACID SOLUTAB) 30 MG disintegrating tablet Take 1 tablet (30 mg total) by mouth daily as needed (acid reflux).   montelukast (SINGULAIR) 5 MG chewable tablet CHEW 2 TABS BY MOUTH AT BEDTIME   No facility-administered encounter medications on file as of 04/02/2023.    Surgical History: History reviewed. No pertinent  surgical history.  Medical History: Past Medical History:  Diagnosis Date   Asthma    Sinus congestion     Family History: Family History  Problem Relation Age of Onset   COPD Mother    Asthma Mother    Arthritis Mother    COPD Father    Asthma Father    Arthritis Father    Arthritis Maternal Grandmother    Congestive Heart Failure Maternal Grandmother    Arthritis Paternal Grandmother     Social History   Socioeconomic History   Marital status: Single    Spouse name: Not on file   Number of children: Not on file   Years of education: Not on file   Highest education level: Not on file  Occupational History   Not on file  Tobacco Use   Smoking status: Never   Smokeless tobacco: Never  Substance and Sexual Activity   Alcohol use: No   Drug use: No   Sexual activity: Not on file  Other Topics Concern   Not on file  Social History Narrative   Not on file   Social Drivers of Health   Financial Resource Strain: Not on file  Food Insecurity: Not on file  Transportation Needs: Not on file  Physical Activity: Not on file  Stress: Not on file  Social Connections: Not on file  Intimate Partner Violence: Not on file      Review of Systems  Constitutional:  Positive for fatigue. Negative for fever.  HENT:  Positive for congestion, postnasal drip and rhinorrhea. Negative for mouth sores.  Respiratory:  Positive for cough. Negative for shortness of breath and wheezing.   Cardiovascular:  Negative for chest pain.  Genitourinary:  Negative for flank pain.  Neurological:  Positive for headaches.  Psychiatric/Behavioral: Negative.      Vital Signs: Resp 16   Ht 5\' 9"  (1.753 m)   Wt (!) 342 lb (155.1 kg)   BMI 50.50 kg/m    Observation/Objective:  Pt is able to carry out conversation   Assessment/Plan: 1. Acute non-recurrent frontal sinusitis (Primary) Will continue mucinex and nasal spray, will send azithromycin to take if symptoms dont continue to  improve. Pt requires liquid form - azithromycin (ZITHROMAX) 200 MG/5ML suspension; Take 10mL Daily for 5 days  Dispense: 50 mL; Refill: 0   General Counseling: Nathan Vaughn verbalizes understanding of the findings of today's phone visit and agrees with plan of treatment. I have discussed any further diagnostic evaluation that may be needed or ordered today. We also reviewed his medications today. he has been encouraged to call the office with any questions or concerns that should arise related to todays visit.    No orders of the defined types were placed in this encounter.   Meds ordered this encounter  Medications   azithromycin (ZITHROMAX) 200 MG/5ML suspension    Sig: Take 10mL Daily for 5 days    Dispense:  50 mL    Refill:  0    Time spent:25 Minutes    Dr Lyndon Code Internal medicine

## 2023-04-03 ENCOUNTER — Telehealth: Payer: Self-pay

## 2023-04-03 NOTE — Telephone Encounter (Signed)
 Patient was notified to hold off on taking his vitamin d pills til he sees Alyssa in office.

## 2023-05-08 ENCOUNTER — Encounter: Payer: Self-pay | Admitting: Nurse Practitioner

## 2023-05-13 ENCOUNTER — Encounter: Payer: Self-pay | Admitting: Nurse Practitioner

## 2023-09-18 ENCOUNTER — Other Ambulatory Visit: Payer: Self-pay | Admitting: Nurse Practitioner

## 2023-09-18 DIAGNOSIS — J4520 Mild intermittent asthma, uncomplicated: Secondary | ICD-10-CM

## 2023-11-21 ENCOUNTER — Other Ambulatory Visit: Payer: Self-pay | Admitting: Internal Medicine

## 2023-12-22 ENCOUNTER — Ambulatory Visit: Admitting: Nurse Practitioner

## 2023-12-22 ENCOUNTER — Encounter: Payer: Self-pay | Admitting: Nurse Practitioner

## 2023-12-22 VITALS — BP 130/70 | HR 88 | Temp 98.4°F | Resp 16 | Ht 69.0 in | Wt 343.2 lb

## 2023-12-22 DIAGNOSIS — J028 Acute pharyngitis due to other specified organisms: Secondary | ICD-10-CM | POA: Diagnosis not present

## 2023-12-22 DIAGNOSIS — J029 Acute pharyngitis, unspecified: Secondary | ICD-10-CM

## 2023-12-22 DIAGNOSIS — B9689 Other specified bacterial agents as the cause of diseases classified elsewhere: Secondary | ICD-10-CM

## 2023-12-22 LAB — POCT RAPID STREP A (OFFICE): Rapid Strep A Screen: NEGATIVE

## 2023-12-22 MED ORDER — AZITHROMYCIN 200 MG/5ML PO SUSR: ORAL | 0 refills | Status: AC

## 2023-12-22 NOTE — Progress Notes (Signed)
 [facility] [address] [place], [place] [address]  Internal MEDICINE  Office Visit Note  Patient Name: Nathan Vaughn  [id number]  [id number]  Date of Service: 12/22/2023  Chief Complaint  Patient presents with   Acute Visit   Sore Throat   Nausea   Vomiting     HPI Nathan Vaughn presents for an acute sick visit for possible upper respiratory infection  --onset of symptoms was about 4 days ago.  He reports sore throat, runny nose, coughing, Sinus drainage, yellow sputum, nasal congestion.  Denies headache, ear pain, fever, chills, fatigue, body aches, sinus pain or pressure, SOB, chest tightness or wheezing.  Negative for covid  Negative for strep     Current Medication:  Outpatient Encounter Medications as of 12/22/2023  Medication Sig   albuterol  (PROVENTIL ) (2.5 MG/3ML) 0.083% nebulizer solution Take 3 mLs (2.5 mg total) by nebulization every 6 (six) hours as needed for wheezing or shortness of breath.   azithromycin  (ZITHROMAX ) 200 MG/5ML suspension Take 12.6 ml by mouth on day 1, then take 6.3 ml by mouth daily for days 2-5 then stop.   ergocalciferol  (DRISDOL ) 1.25 MG (50000 UT) capsule Take one cap q week   fluticasone (FLONASE) 50 MCG/ACT nasal spray PLACE 1 SPRAY IN EACH NOSTRIL ONCE A DAY FOR 30 DAYS AS NEEDED   lansoprazole  (PREVACID  SOLUTAB) 30 MG disintegrating tablet Take 1 tablet (30 mg total) by mouth daily as needed (acid reflux).   montelukast  (SINGULAIR ) 5 MG chewable tablet CHEW 2 TABS BY MOUTH AT BEDTIME   [DISCONTINUED] azithromycin  (ZITHROMAX ) 200 MG/5ML suspension Take 10mL Daily for 5 days   No facility-administered encounter medications on file as of 12/22/2023.      Medical History: Past Medical History:  Diagnosis Date   Asthma    Sinus congestion      Vital Signs: BP 130/70   Pulse 88   Temp 98.4 F (36.9 C)   Resp 16   Ht 5' 9 (1.753 m)   Wt (!) 343 lb 3.2 oz (155.7 kg)   SpO2 96%   BMI 50.68 kg/m    Review  of Systems  Constitutional:  Positive for fatigue. Negative for chills and fever.  HENT:  Positive for congestion, postnasal drip, rhinorrhea and sore throat. Negative for ear pain, mouth sores, sinus pressure and sinus pain.   Respiratory:  Positive for cough. Negative for chest tightness, shortness of breath and wheezing.   Cardiovascular: Negative.  Negative for chest pain and palpitations.  Gastrointestinal: Negative.   Genitourinary:  Negative for flank pain.  Musculoskeletal: Negative.   Skin: Negative.   Neurological:  Positive for headaches.  Psychiatric/Behavioral: Negative.      Physical Exam Vitals reviewed.  Constitutional:      General: He is not in acute distress.    Appearance: He is well-developed. He is obese. He is not ill-appearing.  HENT:     Head: Normocephalic and atraumatic.     Right Ear: Tympanic membrane, ear canal and external ear normal.     Left Ear: Tympanic membrane, ear canal and external ear normal.     Nose: Congestion and rhinorrhea present.     Mouth/Throat:     Mouth: Mucous membranes are moist.     Pharynx: Posterior oropharyngeal erythema present.  Eyes:     Pupils: Pupils are equal, round, and reactive to light.  Cardiovascular:     Rate and Rhythm: Normal rate and regular rhythm.  Pulmonary:     Effort:  Pulmonary effort is normal. No respiratory distress.  Neurological:     Mental Status: He is alert and oriented to person, place, and time.  Psychiatric:        Mood and Affect: Mood normal.        Behavior: Behavior normal.       Assessment/Plan: 1. Acute bacterial pharyngitis (Primary) Zpak prescribed, take until gone  - azithromycin  (ZITHROMAX ) 200 MG/5ML suspension; Take 12.6 ml by mouth on day 1, then take 6.3 ml by mouth daily for days 2-5 then stop.  Dispense: 40 mL; Refill: 0  2. Sore throat Negative for strep - POCT rapid strep A   General Counseling: Nathan Vaughn verbalizes understanding of the findings of todays visit  and agrees with plan of treatment. I have discussed any further diagnostic evaluation that may be needed or ordered today. We also reviewed his medications today. he has been encouraged to call the office with any questions or concerns that should arise related to todays visit.    Counseling:    Orders Placed This Encounter  Procedures   POCT rapid strep A    Meds ordered this encounter  Medications   azithromycin  (ZITHROMAX ) 200 MG/5ML suspension    Sig: Take 12.6 ml by mouth on day 1, then take 6.3 ml by mouth daily for days 2-5 then stop.    Dispense:  40 mL    Refill:  0    Fill new script today    Return for CPE, Nathan Vaughn PCP before the end of the year. .  [place] Controlled Substance Database was reviewed by me for overdose risk score (ORS)  Time spent:30 Minutes Time spent with patient included reviewing progress notes, labs, imaging studies, and discussing plan for follow up.   This patient was seen by Mardy Maxin, FNP-C in collaboration with Dr. Sigrid Bathe as a part of collaborative care agreement.  Nathan Hayne R. Maxin, MSN, FNP-C Internal Medicine

## 2024-01-26 ENCOUNTER — Encounter: Admitting: Nurse Practitioner

## 2024-02-26 ENCOUNTER — Ambulatory Visit: Admitting: Nurse Practitioner

## 2024-02-26 ENCOUNTER — Encounter: Payer: Self-pay | Admitting: Nurse Practitioner

## 2024-02-26 VITALS — BP 130/78 | HR 65 | Temp 98.1°F | Resp 16 | Ht 69.0 in | Wt 350.8 lb

## 2024-02-26 DIAGNOSIS — F321 Major depressive disorder, single episode, moderate: Secondary | ICD-10-CM

## 2024-02-26 DIAGNOSIS — M7662 Achilles tendinitis, left leg: Secondary | ICD-10-CM

## 2024-02-26 DIAGNOSIS — E782 Mixed hyperlipidemia: Secondary | ICD-10-CM

## 2024-02-26 DIAGNOSIS — Z0001 Encounter for general adult medical examination with abnormal findings: Secondary | ICD-10-CM | POA: Diagnosis not present

## 2024-02-26 DIAGNOSIS — M7661 Achilles tendinitis, right leg: Secondary | ICD-10-CM | POA: Diagnosis not present

## 2024-02-26 MED ORDER — SERTRALINE HCL 25 MG PO TABS: 25.0000 mg | ORAL_TABLET | Freq: Every day | ORAL | 3 refills | Status: AC

## 2024-02-26 MED ORDER — TRAMADOL HCL 50 MG PO TABS: 50.0000 mg | ORAL_TABLET | Freq: Four times a day (QID) | ORAL | 0 refills | Status: AC | PRN

## 2024-02-26 NOTE — Progress Notes (Unsigned)
 [facility] [address] [place], [place] [address]  Internal MEDICINE  Office Visit Note  Patient Name: Nathan Vaughn  [id number]  [id number]  Date of Service: 02/26/2024  Chief Complaint  Patient presents with   Annual Exam    HPI Nathan Vaughn presents for an annual well visit and physical exam.  Well-appearing 24 y.o. male with asthma, GERD, and allergic rhinitis  Labs: due for routine labs  New or worsening pain: bilateral ankle pain.  Other concerns: Cannot take any fluoroquinolones due to achilles tendinitis after taking ciprofloxacin last year.  Mother passed away and he is grieving and depressed.     02/26/2024    9:52 AM  Depression screen PHQ 2/9  Decreased Interest 3  Down, Depressed, Hopeless 2  PHQ - 2 Score 5  Altered sleeping 0  Tired, decreased energy 0  Change in appetite 0  Feeling bad or failure about yourself  0  Trouble concentrating 2  Moving slowly or fidgety/restless 0  Suicidal thoughts 0  PHQ-9 Score 7  Difficult doing work/chores Somewhat difficult     Current Medication: Outpatient Encounter Medications as of 02/26/2024  Medication Sig   sertraline  (ZOLOFT ) 25 MG tablet Take 1 tablet (25 mg total) by mouth daily.   traMADol  (ULTRAM ) 50 MG tablet Take 1 tablet (50 mg total) by mouth every 6 (six) hours as needed for up to 5 days for moderate pain (pain score 4-6) or severe pain (pain score 7-10).   albuterol  (PROVENTIL ) (2.5 MG/3ML) 0.083% nebulizer solution Take 3 mLs (2.5 mg total) by nebulization every 6 (six) hours as needed for wheezing or shortness of breath.   azithromycin  (ZITHROMAX ) 200 MG/5ML suspension Take 12.6 ml by mouth on day 1, then take 6.3 ml by mouth daily for days 2-5 then stop.   ergocalciferol  (DRISDOL ) 1.25 MG (50000 UT) capsule Take one cap q week   fluticasone (FLONASE) 50 MCG/ACT nasal spray PLACE 1 SPRAY IN EACH NOSTRIL ONCE A DAY FOR 30 DAYS AS NEEDED   lansoprazole  (PREVACID  SOLUTAB) 30 MG  disintegrating tablet Take 1 tablet (30 mg total) by mouth daily as needed (acid reflux).   montelukast  (SINGULAIR ) 5 MG chewable tablet CHEW 2 TABS BY MOUTH AT BEDTIME   No facility-administered encounter medications on file as of 02/26/2024.    Surgical History: History reviewed. No pertinent surgical history.  Medical History: Past Medical History:  Diagnosis Date   Asthma    Sinus congestion     Family History: Family History  Problem Relation Age of Onset   COPD Mother    Asthma Mother    Arthritis Mother    COPD Father    Asthma Father    Arthritis Father    Arthritis Maternal Grandmother    Congestive Heart Failure Maternal Grandmother    Arthritis Paternal Grandmother     Social History   Socioeconomic History   Marital status: Single    Spouse name: Not on file   Number of children: Not on file   Years of education: Not on file   Highest education level: Not on file  Occupational History   Not on file  Tobacco Use   Smoking status: Never   Smokeless tobacco: Never  Substance and Sexual Activity   Alcohol use: No   Drug use: No   Sexual activity: Not on file  Other Topics Concern   Not on file  Social History Narrative   Not on file   Social Drivers of Health  Tobacco Use: Low Risk (02/26/2024)   Patient History    Smoking Tobacco Use: Never    Smokeless Tobacco Use: Never    Passive Exposure: Not on file  Financial Resource Strain: Low Risk  (12/09/2023)   Received from [facility]   Overall Financial Resource Strain (CARDIA)    Difficulty of Paying Living Expenses: Not hard at all  Food Insecurity: No Food Insecurity (12/09/2023)   Received from [facility]   Epic    Within the past 12 months, you worried that your food would run out before you got the money to buy more.: Never true    Within the past 12 months, the food you bought just didn't last and you didn't have money to  get more.: Never true  Transportation Needs: No Transportation Needs (12/09/2023)   Received from [facility] - Transportation    In the past 12 months, has lack of transportation kept you from medical appointments or from getting medications?: No    Lack of Transportation (Non-Medical): No  Physical Activity: Not on file  Stress: Not on file  Social Connections: Not on file  Intimate Partner Violence: Not on file  Depression (PHQ2-9): Medium Risk (02/26/2024)   Depression (PHQ2-9)    PHQ-2 Score: 7  Alcohol Screen: Low Risk (02/14/2021)   Alcohol Screen    Last Alcohol Screening Score (AUDIT): 0  Housing: Low Risk  (02/08/2024)   Received from [facility]   Epic    In the last 12 months, was there a time when you were not able to pay the mortgage or rent on time?: No    In the past 12 months, how many times have you moved where you were living?: 0    At any time in the past 12 months, were you homeless or living in a shelter (including now)?: No  Utilities: Not At Risk (12/09/2023)   Received from [facility]   Epic    In the past 12 months has the electric, gas, oil, or water company threatened to shut off services in your home?: No  Health Literacy: Not on file      Review of Systems  Constitutional:  Negative for chills, fatigue and unexpected weight change.  HENT:  Positive for postnasal drip. Negative for congestion, rhinorrhea, sneezing and sore throat.   Eyes:  Negative for redness.  Respiratory: Negative.  Negative for cough, chest tightness and shortness of breath.   Cardiovascular: Negative.  Negative for chest pain and palpitations.  Gastrointestinal:  Negative for abdominal pain, constipation, diarrhea, nausea and vomiting.  Genitourinary:  Negative for dysuria and frequency.  Musculoskeletal:  Negative for arthralgias, back pain, joint swelling and neck pain.  Skin:  Negative for rash.   Neurological: Negative.  Negative for tremors and numbness.  Hematological:  Negative for adenopathy. Does not bruise/bleed easily.  Psychiatric/Behavioral:  Positive for behavioral problems (Depression). Negative for self-injury, sleep disturbance and suicidal ideas. The patient is not nervous/anxious.     Vital Signs: BP 130/78   Pulse 65   Temp 98.1 F (36.7 C)   Resp 16   Ht 5' 9 (1.753 m)   Wt (!) 350 lb 12.8 oz (159.1 kg)   SpO2 99%   BMI 51.80 kg/m    Physical Exam Vitals reviewed.  Constitutional:      General: He is awake. He is not in acute distress.    Appearance: Normal  appearance. He is well-developed and well-groomed. He is obese. He is not ill-appearing.  HENT:     Head: Normocephalic and atraumatic.     Right Ear: Tympanic membrane, ear canal and external ear normal.     Left Ear: Tympanic membrane, ear canal and external ear normal.     Nose: Nose normal.     Mouth/Throat:     Lips: Pink.     Mouth: Mucous membranes are moist.     Pharynx: Oropharynx is clear. Uvula midline. No oropharyngeal exudate or posterior oropharyngeal erythema.  Eyes:     General: Lids are normal. Vision grossly intact. Gaze aligned appropriately.     Extraocular Movements: Extraocular movements intact.     Conjunctiva/sclera: Conjunctivae normal.     Pupils: Pupils are equal, round, and reactive to light.  Neck:     Trachea: Trachea and phonation normal.  Cardiovascular:     Rate and Rhythm: Normal rate and regular rhythm.     Pulses: Normal pulses.     Heart sounds: Normal heart sounds, S1 normal and S2 normal.  Pulmonary:     Effort: Pulmonary effort is normal. No accessory muscle usage or respiratory distress.     Breath sounds: Normal breath sounds and air entry. No decreased breath sounds or wheezing.  Chest:     Chest wall: Swelling and edema present.     Comments: Enlarged breast  Abdominal:     General: Bowel sounds are normal.     Palpations: Abdomen is soft.      Tenderness: There is no abdominal tenderness.  Musculoskeletal:     Cervical back: Normal range of motion and neck supple.     Right lower leg: No edema.     Left lower leg: No edema.  Lymphadenopathy:     Cervical: No cervical adenopathy.  Skin:    General: Skin is warm and dry.     Capillary Refill: Capillary refill takes less than 2 seconds.  Neurological:     General: No focal deficit present.     Mental Status: He is alert and oriented to person, place, and time.  Psychiatric:        Mood and Affect: Affect normal. Mood is depressed.        Speech: Speech is delayed.        Behavior: Behavior is slowed. Behavior is cooperative.        Thought Content: Thought content normal.        Judgment: Judgment normal.        Assessment/Plan: 1. Encounter for routine adult health examination with abnormal findings (Primary) Age-appropriate preventive screenings and vaccinations discussed, annual physical exam completed. Routine labs for health maintenance ordered, see below. PHM updated.   - CBC with Differential/Platelet - CMP14+EGFR - Lipid Profile   2. Mixed hyperlipidemia Routine labs ordered  - CBC with Differential/Platelet - CMP14+EGFR - Lipid Profile  3. Achilles tendinitis of both lower extremities Tramadol  prescribed as needed.  - traMADol  (ULTRAM ) 50 MG tablet; Take 1 tablet (50 mg total) by mouth every 6 (six) hours as needed for up to 5 days for moderate pain (pain score 4-6) or severe pain (pain score 7-10).  Dispense: 20 tablet; Refill: 0  4. Depression, major, single episode, moderate (HCC) Start sertraline  as prescribed. Follow up in 4 weeks  - sertraline  (ZOLOFT ) 25 MG tablet; Take 1 tablet (25 mg total) by mouth daily.  Dispense: 30 tablet; Refill: 3     General Counseling: Nathan Vaughn verbalizes  understanding of the findings of todays visit and agrees with plan of treatment. I have discussed any further diagnostic evaluation that may be needed or ordered  today. We also reviewed his medications today. he has been encouraged to call the office with any questions or concerns that should arise related to todays visit.    Orders Placed This Encounter  Procedures   CBC with Differential/Platelet   CMP14+EGFR   Lipid Profile    Meds ordered this encounter  Medications   sertraline  (ZOLOFT ) 25 MG tablet    Sig: Take 1 tablet (25 mg total) by mouth daily.    Dispense:  30 tablet    Refill:  3    Fill new script today.   traMADol  (ULTRAM ) 50 MG tablet    Sig: Take 1 tablet (50 mg total) by mouth every 6 (six) hours as needed for up to 5 days for moderate pain (pain score 4-6) or severe pain (pain score 7-10).    Dispense:  20 tablet    Refill:  0    Fill new script today    Return in about 4 weeks (around 03/25/2024) for F/U, Labs, eval new med, Art Levan PCP.   Total time spent:30 Minutes Time spent includes review of chart, medications, test results, and follow up plan with the patient.   [place] Controlled Substance Database was reviewed by me.  This patient was seen by Mardy Maxin, FNP-C in collaboration with Dr. Sigrid Bathe as a part of collaborative care agreement.  Genine Beckett R. Maxin, MSN, FNP-C Internal medicine

## 2024-02-29 ENCOUNTER — Encounter: Payer: Self-pay | Admitting: Nurse Practitioner

## 2024-03-28 ENCOUNTER — Ambulatory Visit: Admitting: Nurse Practitioner

## 2025-02-28 ENCOUNTER — Encounter: Admitting: Nurse Practitioner
